# Patient Record
Sex: Female | Born: 1965 | Race: White | Hispanic: No | Marital: Single
Health system: Southern US, Community
[De-identification: ages and names within clinical notes are randomized; demographics above are authoritative.]

## PROBLEM LIST (undated history)

## (undated) DIAGNOSIS — E669 Obesity, unspecified: Secondary | ICD-10-CM

## (undated) DIAGNOSIS — I499 Cardiac arrhythmia, unspecified: Secondary | ICD-10-CM

## (undated) DIAGNOSIS — L709 Acne, unspecified: Secondary | ICD-10-CM

## (undated) DIAGNOSIS — E079 Disorder of thyroid, unspecified: Secondary | ICD-10-CM

## (undated) HISTORY — DX: Acne, unspecified: L70.9

## (undated) HISTORY — DX: Disorder of thyroid, unspecified: E07.9

## (undated) HISTORY — DX: Cardiac arrhythmia, unspecified: I49.9

## (undated) HISTORY — DX: Obesity, unspecified: E66.9

---

## 2004-03-09 ENCOUNTER — Other Ambulatory Visit: Admission: RE | Admit: 2004-03-09 | Discharge: 2004-03-09 | Payer: Self-pay | Admitting: Family Medicine

## 2004-06-20 ENCOUNTER — Encounter: Admission: RE | Admit: 2004-06-20 | Discharge: 2004-06-20 | Payer: Self-pay | Admitting: Family Medicine

## 2005-05-15 ENCOUNTER — Other Ambulatory Visit: Admission: RE | Admit: 2005-05-15 | Discharge: 2005-05-15 | Payer: Self-pay | Admitting: Family Medicine

## 2005-06-27 ENCOUNTER — Encounter: Admission: RE | Admit: 2005-06-27 | Discharge: 2005-06-27 | Payer: Self-pay | Admitting: Family Medicine

## 2008-01-07 ENCOUNTER — Other Ambulatory Visit: Admission: RE | Admit: 2008-01-07 | Discharge: 2008-01-07 | Payer: Self-pay | Admitting: Family Medicine

## 2008-02-23 ENCOUNTER — Encounter: Admission: RE | Admit: 2008-02-23 | Discharge: 2008-02-23 | Payer: Self-pay | Admitting: Family Medicine

## 2009-03-08 ENCOUNTER — Encounter: Admission: RE | Admit: 2009-03-08 | Discharge: 2009-03-08 | Payer: Self-pay | Admitting: Family Medicine

## 2010-03-23 ENCOUNTER — Other Ambulatory Visit
Admission: RE | Admit: 2010-03-23 | Discharge: 2010-03-23 | Payer: Self-pay | Source: Home / Self Care | Admitting: Family Medicine

## 2010-04-20 ENCOUNTER — Encounter
Admission: RE | Admit: 2010-04-20 | Discharge: 2010-04-20 | Payer: Self-pay | Source: Home / Self Care | Attending: Family Medicine | Admitting: Family Medicine

## 2010-04-24 ENCOUNTER — Other Ambulatory Visit: Payer: Self-pay | Admitting: Gastroenterology

## 2010-10-05 ENCOUNTER — Emergency Department: Payer: Self-pay | Admitting: Emergency Medicine

## 2010-10-07 DIAGNOSIS — I499 Cardiac arrhythmia, unspecified: Secondary | ICD-10-CM

## 2010-10-07 HISTORY — DX: Cardiac arrhythmia, unspecified: I49.9

## 2011-05-16 ENCOUNTER — Other Ambulatory Visit: Payer: Self-pay | Admitting: Family Medicine

## 2011-05-16 DIAGNOSIS — Z1231 Encounter for screening mammogram for malignant neoplasm of breast: Secondary | ICD-10-CM

## 2011-06-05 ENCOUNTER — Ambulatory Visit
Admission: RE | Admit: 2011-06-05 | Discharge: 2011-06-05 | Disposition: A | Discharge status: Home / Self Care | Payer: BC Managed Care – PPO | Source: Ambulatory Visit | Attending: Family Medicine | Admitting: Family Medicine

## 2011-06-05 DIAGNOSIS — Z1231 Encounter for screening mammogram for malignant neoplasm of breast: Secondary | ICD-10-CM

## 2012-01-21 ENCOUNTER — Other Ambulatory Visit: Payer: Self-pay | Admitting: Family Medicine

## 2012-01-21 DIAGNOSIS — E049 Nontoxic goiter, unspecified: Secondary | ICD-10-CM

## 2012-01-24 ENCOUNTER — Ambulatory Visit
Admission: RE | Admit: 2012-01-24 | Discharge: 2012-01-24 | Disposition: A | Discharge status: Home / Self Care | Payer: BC Managed Care – PPO | Source: Ambulatory Visit | Attending: Family Medicine | Admitting: Family Medicine

## 2012-01-24 DIAGNOSIS — E049 Nontoxic goiter, unspecified: Secondary | ICD-10-CM

## 2012-06-05 ENCOUNTER — Other Ambulatory Visit: Payer: Self-pay

## 2012-06-05 DIAGNOSIS — Z1231 Encounter for screening mammogram for malignant neoplasm of breast: Secondary | ICD-10-CM

## 2012-06-25 ENCOUNTER — Ambulatory Visit
Admission: RE | Admit: 2012-06-25 | Discharge: 2012-06-25 | Disposition: A | Discharge status: Home / Self Care | Payer: BC Managed Care – PPO | Source: Ambulatory Visit

## 2012-06-25 DIAGNOSIS — Z1231 Encounter for screening mammogram for malignant neoplasm of breast: Secondary | ICD-10-CM

## 2013-02-04 ENCOUNTER — Other Ambulatory Visit: Payer: Self-pay | Admitting: Dermatology

## 2013-02-22 ENCOUNTER — Other Ambulatory Visit (HOSPITAL_COMMUNITY)
Admission: RE | Admit: 2013-02-22 | Discharge: 2013-02-22 | Disposition: A | Discharge status: Home / Self Care | Payer: BC Managed Care – PPO | Source: Ambulatory Visit | Attending: Obstetrics and Gynecology | Admitting: Obstetrics and Gynecology

## 2013-02-22 ENCOUNTER — Other Ambulatory Visit: Payer: Self-pay | Admitting: Obstetrics and Gynecology

## 2013-02-22 DIAGNOSIS — Z01419 Encounter for gynecological examination (general) (routine) without abnormal findings: Secondary | ICD-10-CM | POA: Insufficient documentation

## 2013-06-28 ENCOUNTER — Other Ambulatory Visit: Payer: Self-pay

## 2013-06-28 DIAGNOSIS — Z1231 Encounter for screening mammogram for malignant neoplasm of breast: Secondary | ICD-10-CM

## 2013-07-16 ENCOUNTER — Ambulatory Visit: Payer: BC Managed Care – PPO

## 2013-07-23 ENCOUNTER — Ambulatory Visit: Payer: BC Managed Care – PPO

## 2013-08-09 ENCOUNTER — Ambulatory Visit: Payer: BC Managed Care – PPO

## 2013-08-23 ENCOUNTER — Ambulatory Visit
Admission: RE | Admit: 2013-08-23 | Discharge: 2013-08-23 | Disposition: A | Discharge status: Home / Self Care | Payer: BC Managed Care – PPO | Source: Ambulatory Visit

## 2013-08-23 ENCOUNTER — Encounter (INDEPENDENT_AMBULATORY_CARE_PROVIDER_SITE_OTHER): Payer: Self-pay

## 2013-08-23 DIAGNOSIS — Z1231 Encounter for screening mammogram for malignant neoplasm of breast: Secondary | ICD-10-CM

## 2014-10-17 ENCOUNTER — Other Ambulatory Visit: Payer: Self-pay

## 2014-10-17 DIAGNOSIS — Z9289 Personal history of other medical treatment: Secondary | ICD-10-CM

## 2014-10-27 ENCOUNTER — Ambulatory Visit: Payer: Self-pay

## 2014-10-28 ENCOUNTER — Other Ambulatory Visit: Payer: Self-pay

## 2014-10-28 DIAGNOSIS — Z1231 Encounter for screening mammogram for malignant neoplasm of breast: Secondary | ICD-10-CM

## 2014-11-01 ENCOUNTER — Ambulatory Visit
Admission: RE | Admit: 2014-11-01 | Discharge: 2014-11-01 | Disposition: A | Discharge status: Home / Self Care | Payer: BLUE CROSS/BLUE SHIELD | Source: Ambulatory Visit

## 2014-11-01 DIAGNOSIS — Z1231 Encounter for screening mammogram for malignant neoplasm of breast: Secondary | ICD-10-CM

## 2017-01-22 ENCOUNTER — Ambulatory Visit: Payer: Self-pay | Admitting: Adult Health

## 2017-01-22 DIAGNOSIS — Z23 Encounter for immunization: Secondary | ICD-10-CM

## 2017-02-10 ENCOUNTER — Ambulatory Visit: Payer: Self-pay

## 2017-07-08 ENCOUNTER — Other Ambulatory Visit: Payer: Self-pay | Admitting: Family Medicine

## 2017-07-08 DIAGNOSIS — Z1231 Encounter for screening mammogram for malignant neoplasm of breast: Secondary | ICD-10-CM

## 2017-08-12 ENCOUNTER — Ambulatory Visit
Admission: RE | Admit: 2017-08-12 | Discharge: 2017-08-12 | Disposition: A | Discharge status: Home / Self Care | Payer: BLUE CROSS/BLUE SHIELD | Source: Ambulatory Visit | Attending: Family Medicine | Admitting: Family Medicine

## 2017-08-12 DIAGNOSIS — Z1231 Encounter for screening mammogram for malignant neoplasm of breast: Secondary | ICD-10-CM

## 2018-01-27 ENCOUNTER — Other Ambulatory Visit (HOSPITAL_COMMUNITY)
Admission: RE | Admit: 2018-01-27 | Discharge: 2018-01-27 | Disposition: A | Discharge status: Home / Self Care | Payer: No Typology Code available for payment source | Source: Ambulatory Visit | Attending: Family Medicine | Admitting: Family Medicine

## 2018-01-27 ENCOUNTER — Other Ambulatory Visit: Payer: Self-pay | Admitting: Family Medicine

## 2018-01-27 DIAGNOSIS — Z124 Encounter for screening for malignant neoplasm of cervix: Secondary | ICD-10-CM | POA: Insufficient documentation

## 2018-01-30 LAB — CYTOLOGY - PAP
ADEQUACY: ABSENT
Diagnosis: NEGATIVE

## 2019-01-27 ENCOUNTER — Other Ambulatory Visit: Payer: Self-pay

## 2019-01-27 ENCOUNTER — Ambulatory Visit: Payer: Self-pay

## 2019-01-27 DIAGNOSIS — Z23 Encounter for immunization: Secondary | ICD-10-CM

## 2019-09-27 ENCOUNTER — Other Ambulatory Visit: Payer: Self-pay | Admitting: Family Medicine

## 2019-09-27 DIAGNOSIS — Z1231 Encounter for screening mammogram for malignant neoplasm of breast: Secondary | ICD-10-CM

## 2019-09-29 ENCOUNTER — Ambulatory Visit: Payer: No Typology Code available for payment source

## 2019-09-29 ENCOUNTER — Ambulatory Visit
Admission: RE | Admit: 2019-09-29 | Discharge: 2019-09-29 | Disposition: A | Discharge status: Home / Self Care | Payer: Self-pay | Source: Ambulatory Visit | Attending: Family Medicine | Admitting: Family Medicine

## 2019-09-29 ENCOUNTER — Other Ambulatory Visit: Payer: Self-pay

## 2019-09-29 DIAGNOSIS — Z1231 Encounter for screening mammogram for malignant neoplasm of breast: Secondary | ICD-10-CM

## 2020-12-27 ENCOUNTER — Ambulatory Visit: Payer: Self-pay

## 2020-12-27 ENCOUNTER — Other Ambulatory Visit: Payer: Self-pay

## 2020-12-27 DIAGNOSIS — Z23 Encounter for immunization: Secondary | ICD-10-CM

## 2021-05-31 DIAGNOSIS — L720 Epidermal cyst: Secondary | ICD-10-CM | POA: Diagnosis not present

## 2021-06-13 DIAGNOSIS — L0889 Other specified local infections of the skin and subcutaneous tissue: Secondary | ICD-10-CM | POA: Diagnosis not present

## 2021-07-12 ENCOUNTER — Other Ambulatory Visit: Payer: Self-pay | Admitting: Family Medicine

## 2021-07-12 DIAGNOSIS — Z1231 Encounter for screening mammogram for malignant neoplasm of breast: Secondary | ICD-10-CM

## 2021-07-19 ENCOUNTER — Ambulatory Visit: Payer: Self-pay

## 2021-07-20 DIAGNOSIS — K648 Other hemorrhoids: Secondary | ICD-10-CM | POA: Diagnosis not present

## 2021-07-20 DIAGNOSIS — R195 Other fecal abnormalities: Secondary | ICD-10-CM | POA: Diagnosis not present

## 2021-07-20 DIAGNOSIS — K635 Polyp of colon: Secondary | ICD-10-CM | POA: Diagnosis not present

## 2021-07-26 ENCOUNTER — Ambulatory Visit
Admission: RE | Admit: 2021-07-26 | Discharge: 2021-07-26 | Disposition: A | Discharge status: Home / Self Care | Payer: BC Managed Care – PPO | Source: Ambulatory Visit | Attending: Family Medicine | Admitting: Family Medicine

## 2021-07-26 DIAGNOSIS — Z1231 Encounter for screening mammogram for malignant neoplasm of breast: Secondary | ICD-10-CM

## 2021-07-30 DIAGNOSIS — L709 Acne, unspecified: Secondary | ICD-10-CM | POA: Diagnosis not present

## 2021-07-30 DIAGNOSIS — Z13220 Encounter for screening for lipoid disorders: Secondary | ICD-10-CM | POA: Diagnosis not present

## 2021-07-30 DIAGNOSIS — E039 Hypothyroidism, unspecified: Secondary | ICD-10-CM | POA: Diagnosis not present

## 2021-07-30 DIAGNOSIS — R7303 Prediabetes: Secondary | ICD-10-CM | POA: Diagnosis not present

## 2021-09-04 DIAGNOSIS — N390 Urinary tract infection, site not specified: Secondary | ICD-10-CM | POA: Diagnosis not present

## 2021-09-04 DIAGNOSIS — R350 Frequency of micturition: Secondary | ICD-10-CM | POA: Diagnosis not present

## 2021-09-18 DIAGNOSIS — M25571 Pain in right ankle and joints of right foot: Secondary | ICD-10-CM | POA: Diagnosis not present

## 2022-02-06 ENCOUNTER — Ambulatory Visit (INDEPENDENT_AMBULATORY_CARE_PROVIDER_SITE_OTHER): Payer: Self-pay

## 2022-02-06 DIAGNOSIS — H35361 Drusen (degenerative) of macula, right eye: Secondary | ICD-10-CM | POA: Diagnosis not present

## 2022-02-06 DIAGNOSIS — H43811 Vitreous degeneration, right eye: Secondary | ICD-10-CM | POA: Diagnosis not present

## 2022-02-06 DIAGNOSIS — H21341 Primary cyst of pars plana, right eye: Secondary | ICD-10-CM | POA: Diagnosis not present

## 2022-02-06 DIAGNOSIS — Z23 Encounter for immunization: Secondary | ICD-10-CM

## 2022-02-06 DIAGNOSIS — H53141 Visual discomfort, right eye: Secondary | ICD-10-CM | POA: Diagnosis not present

## 2022-04-20 ENCOUNTER — Telehealth: Payer: BC Managed Care – PPO | Admitting: Nurse Practitioner

## 2022-04-20 DIAGNOSIS — J0110 Acute frontal sinusitis, unspecified: Secondary | ICD-10-CM

## 2022-04-20 MED ORDER — FLUTICASONE PROPIONATE 50 MCG/ACT NA SUSP: 2.0000 | Freq: Every day | NASAL | 6 refills | Status: AC

## 2022-04-20 MED ORDER — AMOXICILLIN-POT CLAVULANATE 875-125 MG PO TABS: 1.0000 | ORAL_TABLET | Freq: Two times a day (BID) | ORAL | 0 refills | Status: AC

## 2022-04-20 NOTE — Progress Notes (Signed)
Virtual Visit Consent   Kelsey Houston, you are scheduled for a virtual visit with a [facility] provider today. Just as with appointments in the office, your consent must be obtained to participate. Your consent will be active for this visit and any virtual visit you may have with one of our providers in the next 365 days. If you have a MyChart account, a copy of this consent can be sent to you electronically.  As this is a virtual visit, video technology does not allow for your provider to perform a traditional examination. This may limit your provider's ability to fully assess your condition. If your provider identifies any concerns that need to be evaluated in person or the need to arrange testing (such as labs, EKG, etc.), we will make arrangements to do so. Although advances in technology are sophisticated, we cannot ensure that it will always work on either your end or our end. If the connection with a video visit is poor, the visit may have to be switched to a telephone visit. With either a video or telephone visit, we are not always able to ensure that we have a secure connection.  By engaging in this virtual visit, you consent to the provision of healthcare and authorize for your insurance to be billed (if applicable) for the services provided during this visit. Depending on your insurance coverage, you may receive a charge related to this service.  I need to obtain your verbal consent now. Are you willing to proceed with your visit today? Kelsey Houston has provided verbal consent on 04/20/2022 for a virtual visit (video or telephone). Apolonio Schneiders, FNP  Date: 04/20/2022 5:41 PM  Virtual Visit via Video Note   I, Apolonio Schneiders, connected with  Kelsey Houston  ([id number], 10/25/1965) on 04/20/22 at  5:45 PM EST by a video-enabled telemedicine application and verified that I am speaking with the correct person using two identifiers.  Location: Patient: Virtual Visit Location Patient:  Home Provider: Virtual Visit Location Provider: Home Office   I discussed the limitations of evaluation and management by telemedicine and the availability of in person appointments. The patient expressed understanding and agreed to proceed.    History of Present Illness: Kelsey Houston is a 57 y.o. who identifies as a female who was assigned female at birth, and is being seen today for sinus congestion in her sinuses.  She has been using [employer] with some relief  As the day has progressed she has felt worse  Her cough is productive and thick   She has had a fever   She did take a home COVID test and it was negative.    Denies a history of asthma but she has needed and inhaler in the past   Problems: There are no problems to display for this patient.   Allergies:  Allergies  Allergen Reactions   Aleve [Naproxen Sodium]     headache   Medications:  Current Outpatient Medications:    clindamycin (CLEOCIN T) 1 % lotion, Apply to face as directed for acne, Disp: , Rfl:    levothyroxine (SYNTHROID, LEVOTHROID) 112 MCG tablet, Take 112 mcg by mouth daily before breakfast., Disp: , Rfl:    Tretinoin, Facial Wrinkles, (TRETINOIN, EMOLLIENT,) 0.05 % CREA, Apply topically. As directed for facial wrinkles, Disp: , Rfl:   Observations/Objective: Patient is well-developed, well-nourished in no acute distress.  Resting comfortably  at home.  Head is normocephalic, atraumatic.  No labored breathing.  Speech is clear and coherent  with logical content.  Patient is alert and oriented at baseline.    Assessment and Plan: 1. Acute non-recurrent frontal sinusitis  - amoxicillin-clavulanate (AUGMENTIN) 875-125 MG tablet; Take 1 tablet by mouth 2 (two) times daily for 7 days. Take with food  Dispense: 14 tablet; Refill: 0 - fluticasone (FLONASE) 50 MCG/ACT nasal spray; Place 2 sprays into both nostrils daily.  Dispense: 16 g; Refill: 6     Follow Up Instructions: I discussed the  assessment and treatment plan with the patient. The patient was provided an opportunity to ask questions and all were answered. The patient agreed with the plan and demonstrated an understanding of the instructions.  A copy of instructions were sent to the patient via MyChart unless otherwise noted below.    The patient was advised to call back or seek an in-person evaluation if the symptoms worsen or if the condition fails to improve as anticipated.  Time:  I spent 10 minutes with the patient via telehealth technology discussing the above problems/concerns.    Apolonio Schneiders, FNP

## 2022-05-17 DIAGNOSIS — H43811 Vitreous degeneration, right eye: Secondary | ICD-10-CM | POA: Diagnosis not present

## 2022-05-17 DIAGNOSIS — H53141 Visual discomfort, right eye: Secondary | ICD-10-CM | POA: Diagnosis not present

## 2022-05-17 DIAGNOSIS — H43391 Other vitreous opacities, right eye: Secondary | ICD-10-CM | POA: Diagnosis not present

## 2022-05-17 DIAGNOSIS — H35363 Drusen (degenerative) of macula, bilateral: Secondary | ICD-10-CM | POA: Diagnosis not present

## 2022-07-19 DIAGNOSIS — Z111 Encounter for screening for respiratory tuberculosis: Secondary | ICD-10-CM | POA: Diagnosis not present

## 2022-07-21 DIAGNOSIS — Z0279 Encounter for issue of other medical certificate: Secondary | ICD-10-CM | POA: Diagnosis not present

## 2022-09-10 ENCOUNTER — Other Ambulatory Visit: Payer: Self-pay | Admitting: Family Medicine

## 2022-09-10 DIAGNOSIS — Z1231 Encounter for screening mammogram for malignant neoplasm of breast: Secondary | ICD-10-CM

## 2022-09-16 DIAGNOSIS — L237 Allergic contact dermatitis due to plants, except food: Secondary | ICD-10-CM | POA: Diagnosis not present

## 2022-09-16 DIAGNOSIS — D235 Other benign neoplasm of skin of trunk: Secondary | ICD-10-CM | POA: Diagnosis not present

## 2022-09-16 DIAGNOSIS — L905 Scar conditions and fibrosis of skin: Secondary | ICD-10-CM | POA: Diagnosis not present

## 2022-09-16 DIAGNOSIS — Z411 Encounter for cosmetic surgery: Secondary | ICD-10-CM | POA: Diagnosis not present

## 2022-09-17 DIAGNOSIS — E039 Hypothyroidism, unspecified: Secondary | ICD-10-CM | POA: Diagnosis not present

## 2022-09-17 DIAGNOSIS — Z0000 Encounter for general adult medical examination without abnormal findings: Secondary | ICD-10-CM | POA: Diagnosis not present

## 2022-09-17 DIAGNOSIS — M545 Low back pain, unspecified: Secondary | ICD-10-CM | POA: Diagnosis not present

## 2022-09-17 DIAGNOSIS — Z124 Encounter for screening for malignant neoplasm of cervix: Secondary | ICD-10-CM | POA: Diagnosis not present

## 2022-09-17 DIAGNOSIS — R7303 Prediabetes: Secondary | ICD-10-CM | POA: Diagnosis not present

## 2022-09-25 ENCOUNTER — Ambulatory Visit: Payer: BC Managed Care – PPO

## 2022-10-24 ENCOUNTER — Ambulatory Visit: Payer: BC Managed Care – PPO

## 2022-11-07 ENCOUNTER — Ambulatory Visit
Admission: RE | Admit: 2022-11-07 | Discharge: 2022-11-07 | Disposition: A | Discharge status: Home / Self Care | Payer: BC Managed Care – PPO | Source: Ambulatory Visit | Attending: Family Medicine | Admitting: Family Medicine

## 2022-11-07 DIAGNOSIS — Z1231 Encounter for screening mammogram for malignant neoplasm of breast: Secondary | ICD-10-CM

## 2022-11-20 DIAGNOSIS — H04123 Dry eye syndrome of bilateral lacrimal glands: Secondary | ICD-10-CM | POA: Diagnosis not present

## 2022-11-20 DIAGNOSIS — H43811 Vitreous degeneration, right eye: Secondary | ICD-10-CM | POA: Diagnosis not present

## 2022-11-20 DIAGNOSIS — H35361 Drusen (degenerative) of macula, right eye: Secondary | ICD-10-CM | POA: Diagnosis not present

## 2022-11-20 DIAGNOSIS — H21341 Primary cyst of pars plana, right eye: Secondary | ICD-10-CM | POA: Diagnosis not present

## 2023-02-17 ENCOUNTER — Ambulatory Visit (INDEPENDENT_AMBULATORY_CARE_PROVIDER_SITE_OTHER): Payer: Self-pay

## 2023-02-17 DIAGNOSIS — Z23 Encounter for immunization: Secondary | ICD-10-CM

## 2023-02-17 NOTE — Progress Notes (Signed)
Patient stated she has not traveled outside of the [place] within the past year, is not on any immunosuppressant medications, and does not live with anyone who has an active TB infection. Albina Billet, NP completed and signed patient's medical form.

## 2023-08-01 DIAGNOSIS — M1712 Unilateral primary osteoarthritis, left knee: Secondary | ICD-10-CM | POA: Diagnosis not present

## 2023-08-05 DIAGNOSIS — L659 Nonscarring hair loss, unspecified: Secondary | ICD-10-CM | POA: Diagnosis not present

## 2023-08-27 DIAGNOSIS — M222X2 Patellofemoral disorders, left knee: Secondary | ICD-10-CM | POA: Diagnosis not present

## 2023-10-01 DIAGNOSIS — L720 Epidermal cyst: Secondary | ICD-10-CM | POA: Diagnosis not present

## 2023-10-12 IMAGING — MG MM DIGITAL SCREENING BILAT W/ TOMO AND CAD
8 series · 8 of 24 positions shown · non-contrast
Comparison: Previous exam(s).

CLINICAL DATA: Screening.

EXAM:
DIGITAL SCREENING BILATERAL MAMMOGRAM WITH TOMOSYNTHESIS AND CAD
TECHNIQUE: Bilateral screening digital craniocaudal and mediolateral oblique
mammograms were obtained. Bilateral screening digital breast
tomosynthesis was performed. The images were evaluated with
computer-aided detection.

[R CC synth-2D]
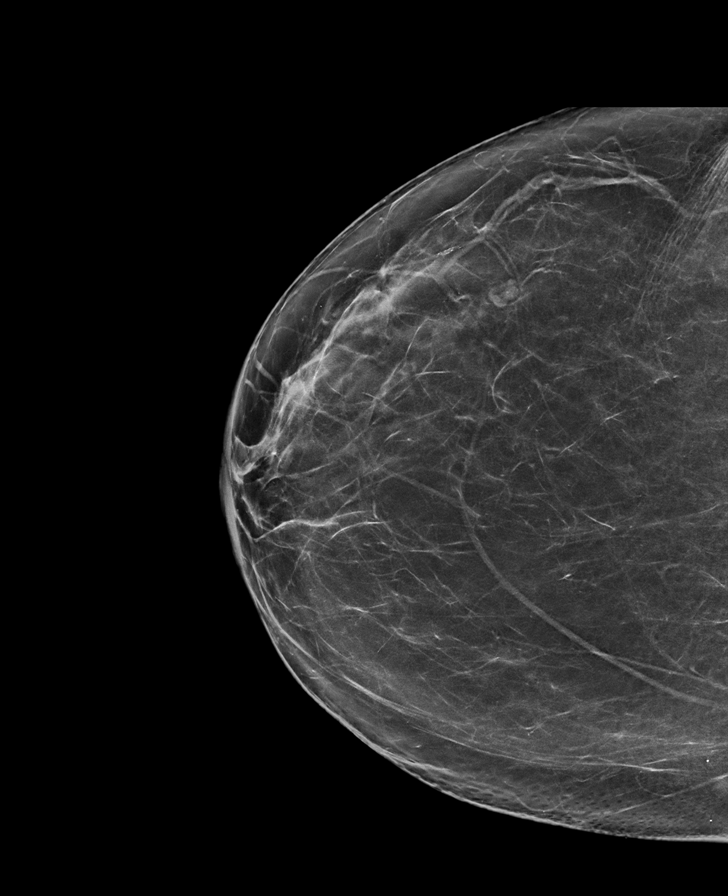

[R MLO synth-2D]
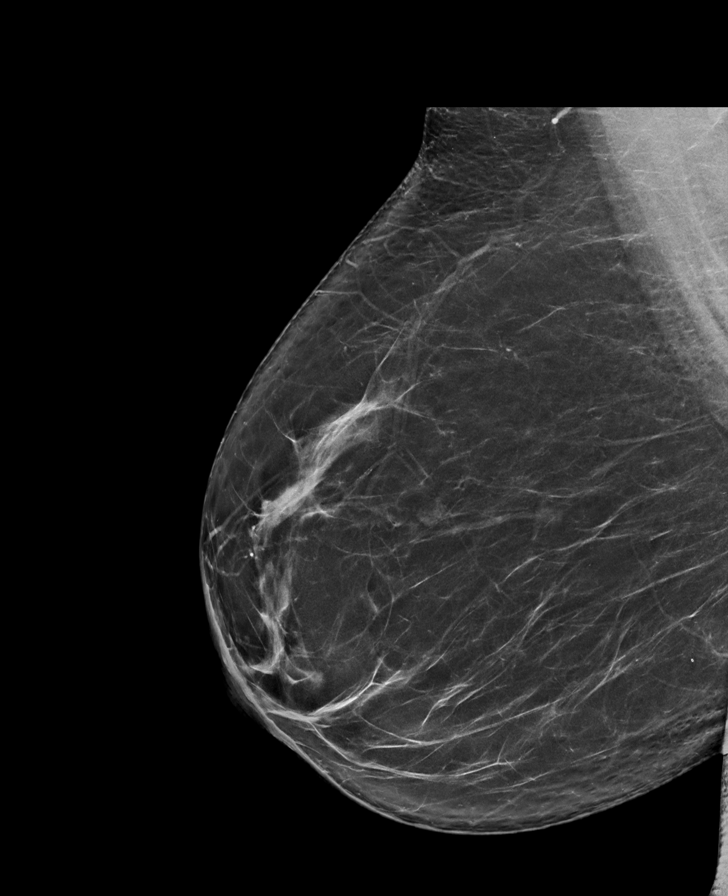

[L MLO synth-2D]
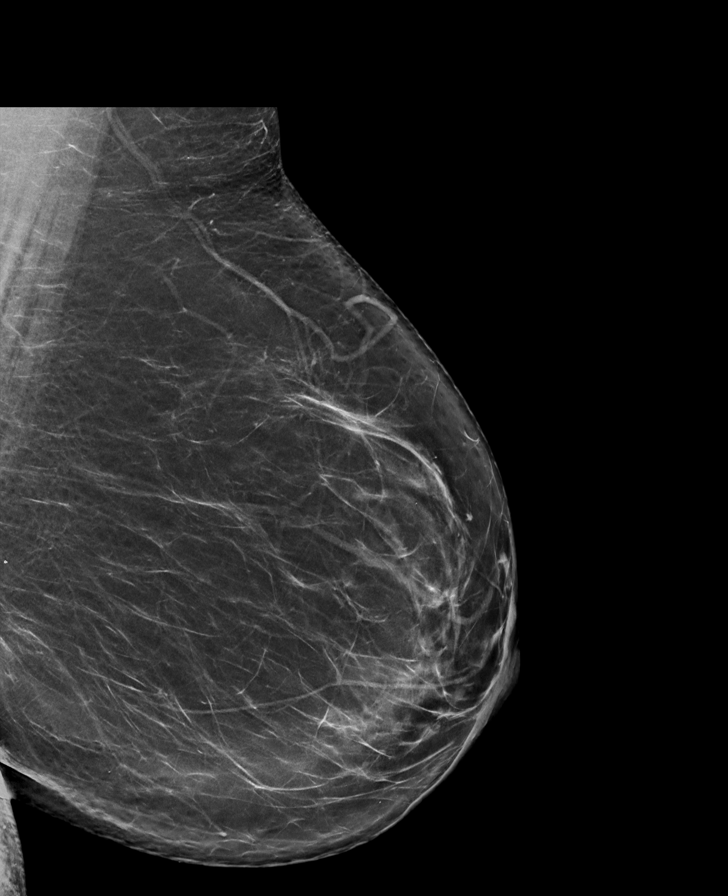

[L CC synth-2D]
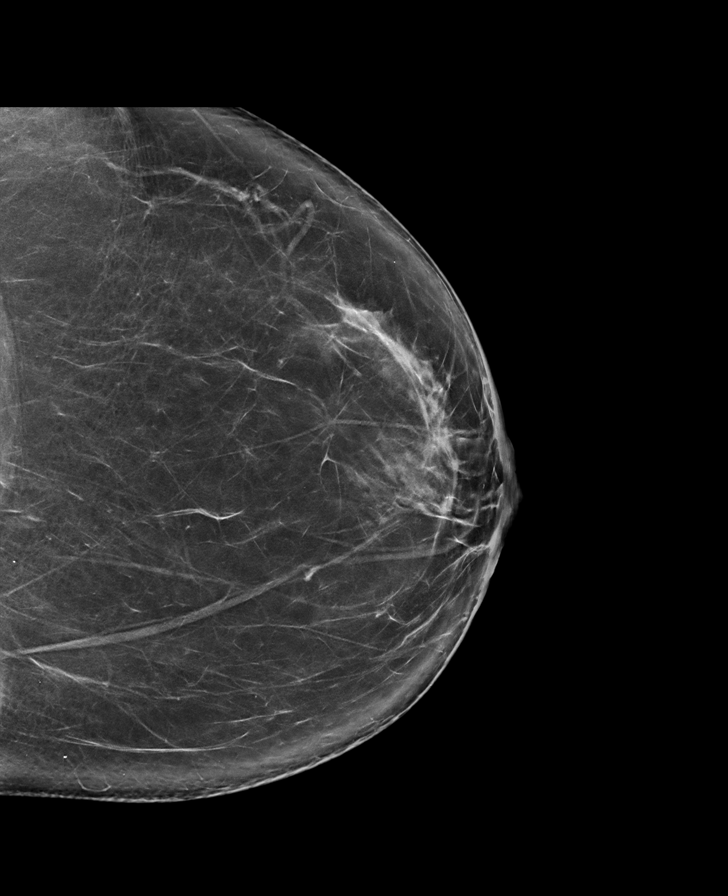

[R CC tomo · tomo slice 39/78.0]
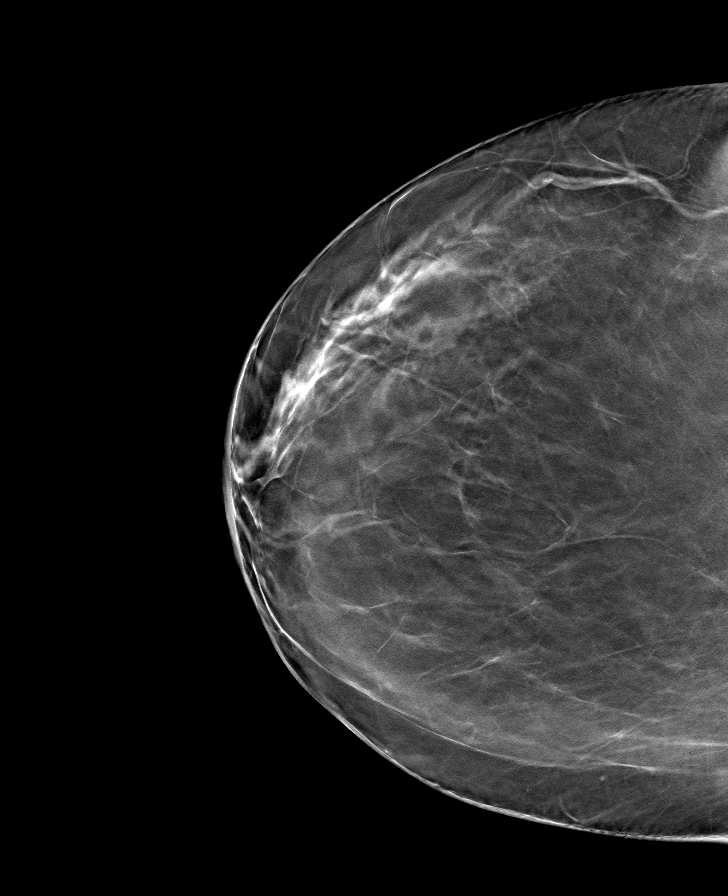

[L MLO tomo · tomo slice 43/86.0]
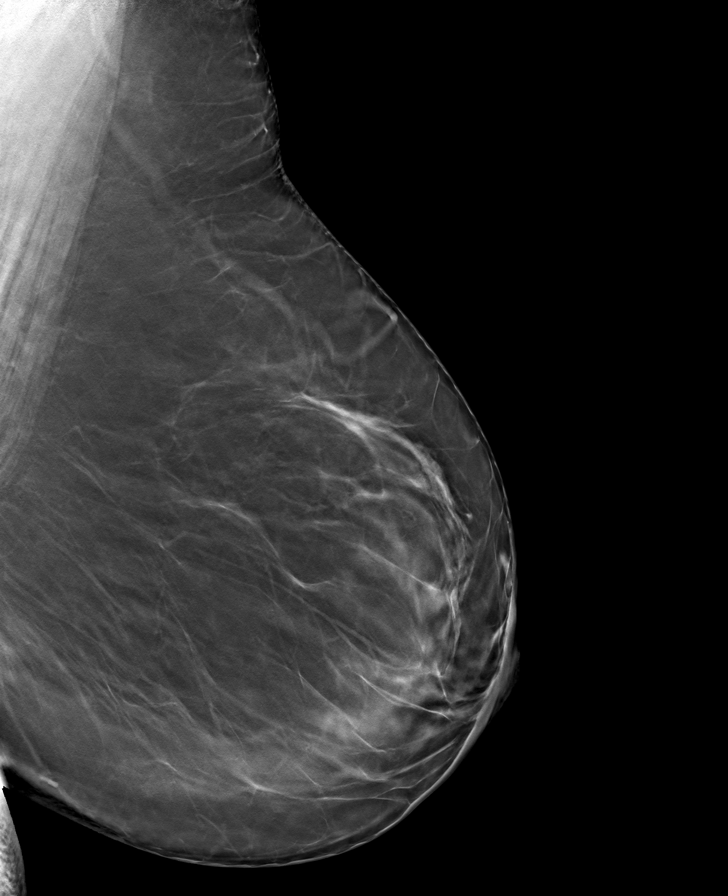

[L CC tomo · tomo slice 43/85.0]
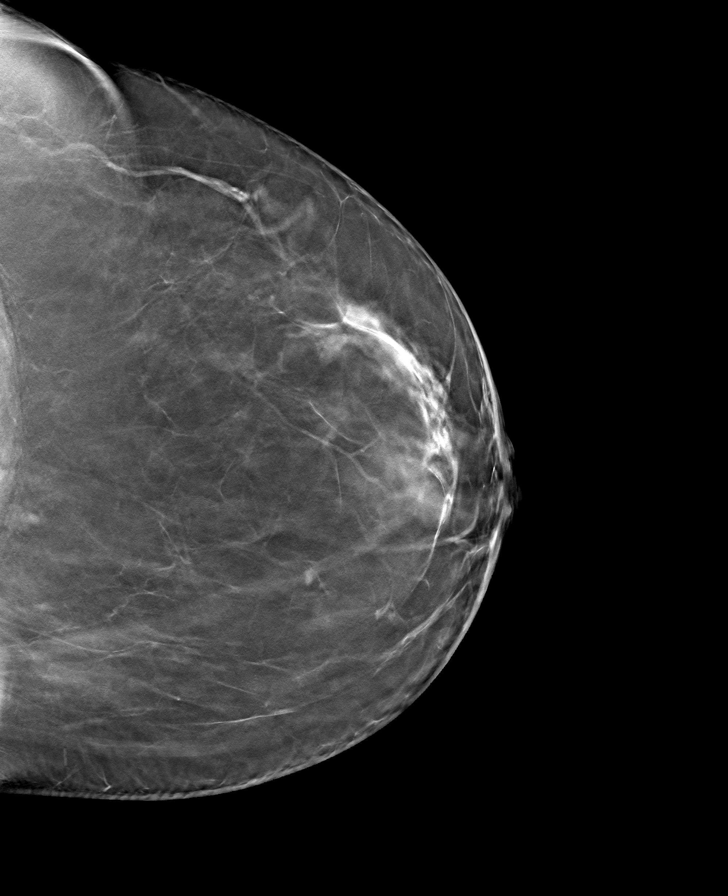

[R MLO tomo · tomo slice 45/88.0]
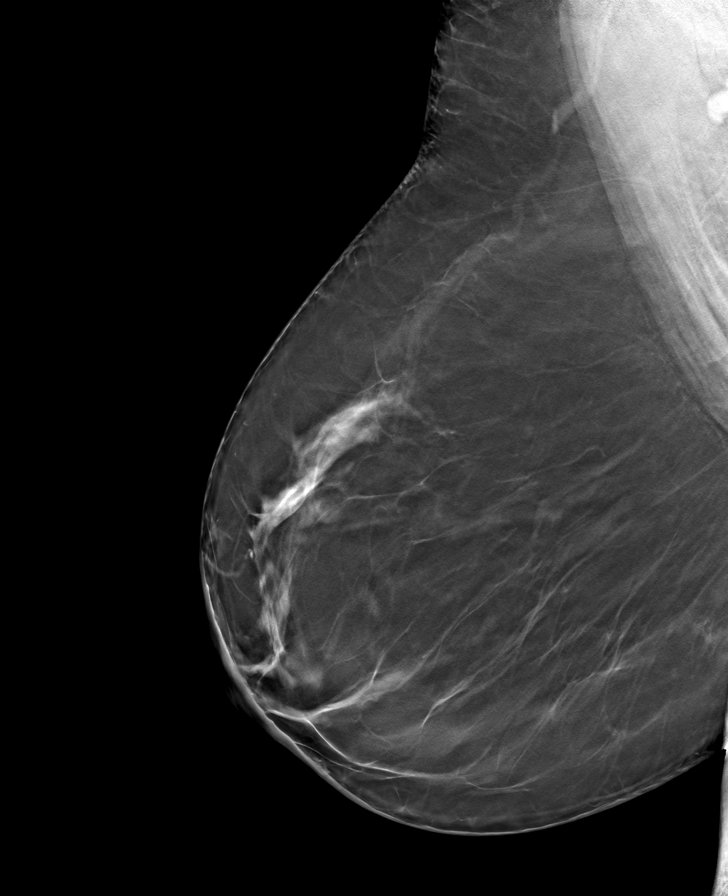

[8 of 24 positions shown; findings below may reference images not displayed]

ACR Breast Density Category b: There are scattered areas of
fibroglandular density.
FINDINGS: There are no findings suspicious for malignancy.
IMPRESSION: No mammographic evidence of malignancy. A result letter of this
screening mammogram will be mailed directly to the patient.

RECOMMENDATION:
Screening mammogram in one year. (Code:51-O-LD2)

BI-RADS CATEGORY  1: Negative.

## 2024-02-10 DIAGNOSIS — L578 Other skin changes due to chronic exposure to nonionizing radiation: Secondary | ICD-10-CM | POA: Diagnosis not present

## 2024-02-10 DIAGNOSIS — L821 Other seborrheic keratosis: Secondary | ICD-10-CM | POA: Diagnosis not present

## 2024-02-10 DIAGNOSIS — D239 Other benign neoplasm of skin, unspecified: Secondary | ICD-10-CM | POA: Diagnosis not present

## 2024-02-10 DIAGNOSIS — L814 Other melanin hyperpigmentation: Secondary | ICD-10-CM | POA: Diagnosis not present

## 2024-02-13 DIAGNOSIS — H2513 Age-related nuclear cataract, bilateral: Secondary | ICD-10-CM | POA: Diagnosis not present

## 2024-02-13 DIAGNOSIS — H43811 Vitreous degeneration, right eye: Secondary | ICD-10-CM | POA: Diagnosis not present

## 2024-02-13 DIAGNOSIS — H04123 Dry eye syndrome of bilateral lacrimal glands: Secondary | ICD-10-CM | POA: Diagnosis not present

## 2024-02-13 DIAGNOSIS — H35361 Drusen (degenerative) of macula, right eye: Secondary | ICD-10-CM | POA: Diagnosis not present
